# Patient Record
Sex: Female | Born: 1985 | Race: White | Hispanic: No | Marital: Married | Smoking: Never smoker
Health system: Southern US, Community
[De-identification: ages and names within clinical notes are randomized; demographics above are authoritative.]

## PROBLEM LIST (undated history)

## (undated) DIAGNOSIS — Z9289 Personal history of other medical treatment: Secondary | ICD-10-CM

## (undated) DIAGNOSIS — I214 Non-ST elevation (NSTEMI) myocardial infarction: Secondary | ICD-10-CM

## (undated) DIAGNOSIS — F419 Anxiety disorder, unspecified: Secondary | ICD-10-CM

## (undated) DIAGNOSIS — K219 Gastro-esophageal reflux disease without esophagitis: Secondary | ICD-10-CM

## (undated) HISTORY — DX: Personal history of other medical treatment: Z92.89

## (undated) HISTORY — PX: NO PAST SURGERIES: SHX2092

---

## 2013-09-14 DIAGNOSIS — Z9289 Personal history of other medical treatment: Secondary | ICD-10-CM

## 2013-09-14 HISTORY — DX: Personal history of other medical treatment: Z92.89

## 2013-09-22 DIAGNOSIS — I214 Non-ST elevation (NSTEMI) myocardial infarction: Secondary | ICD-10-CM

## 2013-09-22 DIAGNOSIS — Z9289 Personal history of other medical treatment: Secondary | ICD-10-CM

## 2013-09-22 HISTORY — DX: Non-ST elevation (NSTEMI) myocardial infarction: I21.4

## 2013-09-22 HISTORY — DX: Personal history of other medical treatment: Z92.89

## 2013-09-24 DIAGNOSIS — Z9289 Personal history of other medical treatment: Secondary | ICD-10-CM

## 2013-09-24 HISTORY — DX: Personal history of other medical treatment: Z92.89

## 2013-12-27 ENCOUNTER — Observation Stay (HOSPITAL_COMMUNITY)
Admission: EM | Admit: 2013-12-27 | Discharge: 2013-12-28 | Disposition: A | Discharge status: Home / Self Care | Payer: 59 | Attending: Cardiology | Admitting: Cardiology

## 2013-12-27 ENCOUNTER — Emergency Department (HOSPITAL_COMMUNITY): Payer: 59

## 2013-12-27 ENCOUNTER — Encounter (HOSPITAL_COMMUNITY): Payer: Self-pay | Admitting: Emergency Medicine

## 2013-12-27 DIAGNOSIS — F411 Generalized anxiety disorder: Secondary | ICD-10-CM | POA: Insufficient documentation

## 2013-12-27 DIAGNOSIS — I201 Angina pectoris with documented spasm: Secondary | ICD-10-CM

## 2013-12-27 DIAGNOSIS — I252 Old myocardial infarction: Secondary | ICD-10-CM

## 2013-12-27 DIAGNOSIS — R079 Chest pain, unspecified: Principal | ICD-10-CM

## 2013-12-27 DIAGNOSIS — K219 Gastro-esophageal reflux disease without esophagitis: Secondary | ICD-10-CM | POA: Insufficient documentation

## 2013-12-27 HISTORY — DX: Personal history of other medical treatment: Z92.89

## 2013-12-27 HISTORY — DX: Non-ST elevation (NSTEMI) myocardial infarction: I21.4

## 2013-12-27 HISTORY — DX: Anxiety disorder, unspecified: F41.9

## 2013-12-27 HISTORY — DX: Gastro-esophageal reflux disease without esophagitis: K21.9

## 2013-12-27 LAB — BASIC METABOLIC PANEL
BUN: 12 mg/dL (ref 6–23)
CHLORIDE: 101 meq/L (ref 96–112)
CO2: 25 mEq/L (ref 19–32)
Calcium: 10.2 mg/dL (ref 8.4–10.5)
Creatinine, Ser: 0.9 mg/dL (ref 0.50–1.10)
GFR calc non Af Amer: 87 mL/min — ABNORMAL LOW (ref >90)
Glucose, Bld: 94 mg/dL (ref 70–99)
POTASSIUM: 4 meq/L (ref 3.7–5.3)
SODIUM: 140 meq/L (ref 137–147)

## 2013-12-27 LAB — CBC
HCT: 40.7 % (ref 36.0–46.0)
Hemoglobin: 14.5 g/dL (ref 12.0–15.0)
MCH: 28.7 pg (ref 26.0–34.0)
MCHC: 35.6 g/dL (ref 30.0–36.0)
MCV: 80.4 fL (ref 78.0–100.0)
Platelets: 309 10*3/uL (ref 150–400)
RBC: 5.06 MIL/uL (ref 3.87–5.11)
RDW: 12.4 % (ref 11.5–15.5)
WBC: 10.4 10*3/uL (ref 4.0–10.5)

## 2013-12-27 LAB — PREGNANCY, URINE: PREG TEST UR: NEGATIVE

## 2013-12-27 LAB — PROTIME-INR
INR: 0.96 (ref 0.00–1.49)
INR: 1.08 (ref 0.00–1.49)
PROTHROMBIN TIME: 12.6 s (ref 11.6–15.2)
Prothrombin Time: 13.8 seconds (ref 11.6–15.2)

## 2013-12-27 LAB — RAPID URINE DRUG SCREEN, HOSP PERFORMED
Amphetamines: NOT DETECTED
Barbiturates: NOT DETECTED
Benzodiazepines: NOT DETECTED
Cocaine: NOT DETECTED
OPIATES: NOT DETECTED
Tetrahydrocannabinol: NOT DETECTED

## 2013-12-27 LAB — POCT I-STAT TROPONIN I: TROPONIN I, POC: 0 ng/mL (ref 0.00–0.08)

## 2013-12-27 LAB — MAGNESIUM: MAGNESIUM: 2 mg/dL (ref 1.5–2.5)

## 2013-12-27 LAB — TROPONIN I: Troponin I: <0.3 ng/mL (ref <0.30)

## 2013-12-27 MED ORDER — ATORVASTATIN CALCIUM 10 MG PO TABS
10.0000 mg | ORAL_TABLET | Freq: Every day | ORAL | Status: DC
  Administered 2013-12-28: 10 mg via ORAL
  Filled 2013-12-27 (×3): qty 1

## 2013-12-27 MED ORDER — SODIUM CHLORIDE 0.9 % IV BOLUS (SEPSIS)
1000.0000 mL | Freq: Once | INTRAVENOUS | Status: AC
  Administered 2013-12-27: 1000 mL via INTRAVENOUS

## 2013-12-27 MED ORDER — NITROGLYCERIN 0.4 MG SL SUBL
0.4000 mg | SUBLINGUAL_TABLET | SUBLINGUAL | Status: DC | PRN
  Administered 2013-12-27 (×3): 0.4 mg via SUBLINGUAL
  Filled 2013-12-27: qty 25

## 2013-12-27 MED ORDER — NITROGLYCERIN 0.4 MG SL SUBL
0.4000 mg | SUBLINGUAL_TABLET | SUBLINGUAL | Status: DC | PRN
  Filled 2013-12-27: qty 25

## 2013-12-27 MED ORDER — BUTALBITAL-APAP-CAFFEINE 50-325-40 MG PO TABS: 2.0000 | ORAL_TABLET | ORAL | Status: DC | PRN

## 2013-12-27 MED ORDER — ACETAMINOPHEN 325 MG PO TABS
650.0000 mg | ORAL_TABLET | ORAL | Status: DC | PRN
  Administered 2013-12-27 – 2013-12-28 (×3): 650 mg via ORAL
  Filled 2013-12-27 (×3): qty 2

## 2013-12-27 MED ORDER — ISOSORBIDE MONONITRATE 15 MG HALF TABLET
15.0000 mg | ORAL_TABLET | Freq: Every day | ORAL | Status: DC
  Administered 2013-12-28: 15 mg via ORAL
  Filled 2013-12-27: qty 1

## 2013-12-27 MED ORDER — ZOLPIDEM TARTRATE 5 MG PO TABS: 5.0000 mg | ORAL_TABLET | Freq: Every evening | ORAL | Status: DC | PRN

## 2013-12-27 MED ORDER — ATORVASTATIN CALCIUM 10 MG PO TABS: 10.0000 mg | ORAL_TABLET | Freq: Every day | ORAL | Status: DC

## 2013-12-27 MED ORDER — ASPIRIN 81 MG PO CHEW
243.0000 mg | CHEWABLE_TABLET | Freq: Once | ORAL | Status: AC
  Administered 2013-12-27: 243 mg via ORAL
  Filled 2013-12-27: qty 3

## 2013-12-27 MED ORDER — ENOXAPARIN SODIUM 40 MG/0.4ML ~~LOC~~ SOLN
40.0000 mg | SUBCUTANEOUS | Status: DC
  Administered 2013-12-28: 40 mg via SUBCUTANEOUS
  Filled 2013-12-27 (×3): qty 0.4

## 2013-12-27 MED ORDER — AMLODIPINE BESYLATE 5 MG PO TABS
5.0000 mg | ORAL_TABLET | Freq: Every day | ORAL | Status: DC
  Administered 2013-12-28: 5 mg via ORAL
  Filled 2013-12-27: qty 1

## 2013-12-27 MED ORDER — GI COCKTAIL ~~LOC~~
30.0000 mL | Freq: Once | ORAL | Status: AC
  Administered 2013-12-27: 30 mL via ORAL
  Filled 2013-12-27: qty 30

## 2013-12-27 MED ORDER — ASPIRIN EC 81 MG PO TBEC: 81.0000 mg | DELAYED_RELEASE_TABLET | Freq: Every day | ORAL | Status: DC

## 2013-12-27 MED ORDER — CLOPIDOGREL BISULFATE 75 MG PO TABS
75.0000 mg | ORAL_TABLET | Freq: Every day | ORAL | Status: DC
  Administered 2013-12-28: 75 mg via ORAL
  Filled 2013-12-27 (×2): qty 1

## 2013-12-27 MED ORDER — ONDANSETRON HCL 4 MG/2ML IJ SOLN
4.0000 mg | Freq: Four times a day (QID) | INTRAMUSCULAR | Status: DC | PRN
Start: 2013-12-27 — End: 2013-12-28

## 2013-12-27 MED ORDER — ATORVASTATIN CALCIUM 20 MG PO TABS
20.0000 mg | ORAL_TABLET | Freq: Every day | ORAL | Status: DC
  Filled 2013-12-27: qty 1

## 2013-12-27 MED ORDER — ASPIRIN EC 81 MG PO TBEC
81.0000 mg | DELAYED_RELEASE_TABLET | Freq: Every day | ORAL | Status: DC
  Administered 2013-12-28: 81 mg via ORAL
  Filled 2013-12-27: qty 1

## 2013-12-27 MED ORDER — ALPRAZOLAM 0.25 MG PO TABS: 0.2500 mg | ORAL_TABLET | Freq: Two times a day (BID) | ORAL | Status: DC | PRN

## 2013-12-27 NOTE — ED Notes (Signed)
Patient with c/o 1/10 CP after walking back to room from bathroom [facility] staff present on unit and have made charge nurse at [facility] aware of patient's c/o pain 1 SL NTG given, see MAR

## 2013-12-27 NOTE — ED Notes (Signed)
Bed: [id number] Expected date:  Expected time:  Means of arrival:  Comments: Hold chest pain in triage/ EKG done

## 2013-12-27 NOTE — Progress Notes (Signed)
   CARE MANAGEMENT ED NOTE 12/27/2013  Patient:  Eisenhour,Delma   Account Number:  [id number]  Date Initiated:  12/27/2013  Documentation initiated by:  Radford PaxFERRERO,Arrow Emmerich  Subjective/Objective Assessment:   Patient presents to ED with chest pain     Subjective/Objective Assessment Detail:     Action/Plan:   Action/Plan Detail:   Anticipated DC Date:       Status Recommendation to Physician:   Result of Recommendation:    Other ED Services  Consult Working Plan    DC Planning Services  Other  PCP issues    Choice offered to / List presented to:            Status of service:  Completed, signed off  ED Comments:   ED Comments Detail:  patient confirms her pcp is Dr. Edrick Ohobert Thomas.  System updated

## 2013-12-27 NOTE — H&P (Signed)
Stephanie Nash is an 28 y.o. female.    Primary Cardiologist:in [place]  Dr. Dorothy Puffer No primary provider on file.  Chief Complaint: chest pain HPI:  Stephanie Nash is a 28 y.o. female hx of MI 3 months ago (had positive troponins pk at 2.67, no cath was done) here with chest pain. Some bilateral arm pain since yesterday. Today around 11 AM had acute onset of substernal chest pain similar to 3 months ago. Denies any shortness of breath. She had CT angio chest several weeks ago as well as CT coronary arteries that showed no PE or obvious CAD. Her cardiologist in the [place].  She also had echo in Oct that was unremarkable.  Her cardiologist thought perhaps the MI was dissection vs. Spasm and with normal Cardiac CT he was thinking spasm.  She had been on metoprolol but was this was stopped 1-2 months ago and she is on amlodipine and statin as well as Plavix and ASA.  Since Oct. She may have had 4 episodes of chest pressure each lasting < 30 min.  One of these episodes she thought was GI.  She is working out.  With bursts of extreme exertion yesterday she had an episode of chest pressure.  But with rest it resolved.  Last night she had some rt arm pain.  Then today she did her workout without problems.  But later she developed mid to rt sternal chest pain.  It continues now despite GI cocktail.    She does continue with the pain now.      Past Medical History  Diagnosis Date  . MI (myocardial infarction)   . Hx of myocardial infarction; 09/2013, possible spasm 12/27/2013    History reviewed. No pertinent past surgical history.  History reviewed. No pertinent family history.  Parents are healthy no close relatives with CAD,  Social History:  reports that she has never smoked. She has never used smokeless tobacco. She reports that she drinks alcohol. She reports that she does not use illicit drugs. She is married and had been doing half marathons prior to Oct.  Allergies:    Allergies  Allergen Reactions  . Septra [Sulfamethoxazole-Tmp Ds] Hives    OUTPATIENT Medications: Plavix 75 mg daily ASA 81 mg daily Amlodipine 5 mg daily Lipitor daily  Results for orders placed during the hospital encounter of 12/27/13 (from the past 48 hour(s))  CBC     Status: None   Collection Time    12/27/13  1:58 PM      Result Value Range   WBC 10.4  4.0 - 10.5 K/uL   RBC 5.06  3.87 - 5.11 MIL/uL   Hemoglobin 14.5  12.0 - 15.0 g/dL   HCT 40.7  36.0 - 46.0 %   MCV 80.4  78.0 - 100.0 fL   MCH 28.7  26.0 - 34.0 pg   MCHC 35.6  30.0 - 36.0 g/dL   RDW 12.4  11.5 - 15.5 %   Platelets 309  150 - 400 K/uL  BASIC METABOLIC PANEL     Status: Abnormal   Collection Time    12/27/13  1:58 PM      Result Value Range   Sodium 140  137 - 147 mEq/L   Potassium 4.0  3.7 - 5.3 mEq/L   Chloride 101  96 - 112 mEq/L   CO2 25  19 - 32 mEq/L   Glucose, Bld 94  70 - 99 mg/dL  BUN 12  6 - 23 mg/dL   Creatinine, Ser 0.90  0.50 - 1.10 mg/dL   Calcium 10.2  8.4 - 10.5 mg/dL   GFR calc non Af Amer 87 (*) >90 mL/min   GFR calc Af Amer >90  >90 mL/min   Comment: (NOTE)     The eGFR has been calculated using the CKD EPI equation.     This calculation has not been validated in all clinical situations.     eGFR's persistently <90 mL/min signify possible Chronic Kidney     Disease.  PROTIME-INR     Status: None   Collection Time    12/27/13  1:58 PM      Result Value Range   Prothrombin Time 12.6  11.6 - 15.2 seconds   INR 0.96  0.00 - 1.49  POCT I-STAT TROPONIN I     Status: None   Collection Time    12/27/13  2:05 PM      Result Value Range   Troponin i, poc 0.00  0.00 - 0.08 ng/mL   Comment 3            Comment: Due to the release kinetics of cTnI,     a negative result within the first hours     of the onset of symptoms does not rule out     myocardial infarction with certainty.     If myocardial infarction is still suspected,     repeat the test at appropriate intervals.   URINE RAPID DRUG SCREEN (HOSP PERFORMED)     Status: None   Collection Time    12/27/13  3:25 PM      Result Value Range   Opiates NONE DETECTED  NONE DETECTED   Cocaine NONE DETECTED  NONE DETECTED   Benzodiazepines NONE DETECTED  NONE DETECTED   Amphetamines NONE DETECTED  NONE DETECTED   Tetrahydrocannabinol NONE DETECTED  NONE DETECTED   Barbiturates NONE DETECTED  NONE DETECTED   Comment:            DRUG SCREEN FOR MEDICAL PURPOSES     ONLY.  IF CONFIRMATION IS NEEDED     FOR ANY PURPOSE, NOTIFY LAB     WITHIN 5 DAYS.                LOWEST DETECTABLE LIMITS     FOR URINE DRUG SCREEN     Drug Class       Cutoff (ng/mL)     Amphetamine      1000     Barbiturate      200     Benzodiazepine   456     Tricyclics       256     Opiates          300     Cocaine          300     THC              50  PREGNANCY, URINE     Status: None   Collection Time    12/27/13  3:25 PM      Result Value Range   Preg Test, Ur NEGATIVE  NEGATIVE   Comment:            THE SENSITIVITY OF THIS     METHODOLOGY IS >20 mIU/mL.   Dg Chest Port 1 View  12/27/2013   CLINICAL DATA:  Chest pain  EXAM: PORTABLE CHEST - 1 VIEW  COMPARISON:  None.  FINDINGS: The heart size and mediastinal contours are within normal limits. Both lungs are clear. The visualized skeletal structures are unremarkable.  IMPRESSION: No active disease.   Electronically Signed   By: Inez Catalina M.D.   On: 12/27/2013 14:20    ROS: General:recent cold but no OTC meds or fevers, no weight changes Skin:no rashes or ulcers HEENT:no blurred vision, no congestion CV:see HPI PUL:see HPI GI:no diarrhea constipation or melena, no indigestion GU:no hematuria, no dysuria MS:no joint pain, no claudication Neuro:no syncope, no lightheadedness Endo:no diabetes, no thyroid disease   Blood pressure 129/88, pulse 83, resp. rate 18, last menstrual period 12/11/2013, SpO2 100.00%. PE: General:Pleasant affect, NAD Skin:Warm and dry, brisk  capillary refill HEENT:normocephalic, sclera clear, mucus membranes moist Neck:supple, no JVD, no bruits  Heart:S1S2 RRR without murmur, gallup, rub or click Lungs:clear without rales, rhonchi, or wheezes JJH:ERDE, non tender, + BS, do not palpate liver spleen or masses Ext:no lower ext edema, 2+ pedal pulses, 2+ radial pulses Neuro:alert and oriented, MAE, follows commands, + facial symmetry    Assessment/Plan   YCXKGY,JEHUD R Nurse Practitioner Certified [place] Pager [phone] or after 5pm or weekends call [phone] 12/27/2013, 5:36 PM  Patient seen with NP, agree with the above note.   I did review data from the hospital in [place] in 10/14:  She had TnI elevation to about 2.3 with chest pain.  ECG was unremarkable.  D dimer was elevated but PE CT showed no PE or other pulmonary abnormality.  She had a coronary CT angiogram done that showed no coronary anomaly and no evidence for CAD or for coronary dissection.  She was sent home on amlodipine 5 mg daily for possible vasospasm.  She was on Imdur (not sure what dose) that was stopped due to headache.  She had submaximal ETT prior to discharge that was normal.  TSH normal.  Echo showed EF 50-55%, no significant abnormalities.   Today, she had similar chest pain to 10/14.  It has been prolonged and is still present, 2/10. There was no particular trigger.  She has been jogging recently without symptoms.  ECG is normal and initial troponin is negative.  Urine drug screen negative, urine pregnancy test negative.   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  She appears to have had a thorough workup in 10/14 in [place].  No PE, no coronary anomaly and no evidence for coronary dissection or CAD.  Doubt myopericarditis as would be unusual to recur several months later.   - Admit to observation.  - Cycle cardiac enzymes - Continue amlodipine 5 mg daily - I will given her sublingual NTG to see if it helps  chest pain.  She had a headache on Imdur in the past, do not know what dose.  I am going to see if she can tolerate a low dose, 15 mg daily.  - Given the recent coronary CTA, I probably would not do left heart cath unless troponin were to be markedly elevated.   Loralie Champagne 12/27/2013 5:50 PM

## 2013-12-27 NOTE — ED Provider Notes (Signed)
CSN: [id number]     Arrival date & time 12/27/13  1324 History   First MD Initiated Contact with Patient 12/27/13 1339     Chief Complaint  Patient presents with  . Chest Pain   (Consider location/radiation/quality/duration/timing/severity/associated sxs/prior Treatment) The history is provided by the patient.  Stephanie Nash is a 28 y.o. female hx of MI 3 months ago (had positive troponins, no cath was done) here with chest pain. Some bilateral arm pain since yesterday. Today around 11 AM had acute onset of substernal chest pain similar to 3 months ago. Denies any shortness of breath. She had CT angio chest several weeks ago as well as CT coronary arteries that showed no PE or obvious CAD. Her cardiologist in the [place].    Past Medical History  Diagnosis Date  . MI (myocardial infarction)    History reviewed. No pertinent past surgical history. No family history on file. History  Substance Use Topics  . Smoking status: Never Smoker   . Smokeless tobacco: Not on file  . Alcohol Use: Yes   OB History   Grav Para Term Preterm Abortions TAB SAB Ect Mult Living                 Review of Systems  Cardiovascular: Positive for chest pain.  All other systems reviewed and are negative.    Allergies  Septra  Home Medications   Current Outpatient Rx  Name  Route  Sig  Dispense  Refill  . amLODipine (NORVASC) 5 MG tablet   Oral   Take 5 mg by mouth daily.         Marland Kitchen aspirin EC 81 MG tablet   Oral   Take 81 mg by mouth daily.         Marland Kitchen atorvastatin (LIPITOR) 10 MG tablet   Oral   Take 10 mg by mouth daily.         . clopidogrel (PLAVIX) 75 MG tablet   Oral   Take 75 mg by mouth daily with breakfast.          BP 129/88  Pulse 83  Resp 18  SpO2 100%  LMP 12/11/2013 Physical Exam  Nursing note and vitals reviewed. Constitutional: She is oriented to person, place, and time. She appears well-nourished.  Anxious, slightly uncomfortable   HENT:  Head:  Normocephalic.  Mouth/Throat: Oropharynx is clear and moist.  Eyes: Conjunctivae are normal. Pupils are equal, round, and reactive to light.  Neck: Normal range of motion. Neck supple.  Cardiovascular: Regular rhythm and normal heart sounds.   Slightly tachycardic   Pulmonary/Chest: Effort normal and breath sounds normal. No respiratory distress. She has no wheezes. She has no rales.  Abdominal: Soft. Bowel sounds are normal. She exhibits no distension. There is no tenderness. There is no rebound and no guarding.  Musculoskeletal: Normal range of motion. She exhibits no edema and no tenderness.  Neurological: She is alert and oriented to person, place, and time. No cranial nerve deficit. Coordination normal.  Skin: Skin is warm and dry.  Psychiatric: She has a normal mood and affect. Her behavior is normal. Thought content normal.    ED Course  Procedures (including critical care time) Labs Review Labs Reviewed  BASIC METABOLIC PANEL - Abnormal; Notable for the following:    GFR calc non Af Amer 87 (*)    All other components within normal limits  CBC  PROTIME-INR  PREGNANCY, URINE  URINE RAPID DRUG SCREEN (HOSP PERFORMED)  POCT  I-STAT TROPONIN I   Imaging Review Dg Chest Port 1 View  12/27/2013   CLINICAL DATA:  Chest pain  EXAM: PORTABLE CHEST - 1 VIEW  COMPARISON:  None.  FINDINGS: The heart size and mediastinal contours are within normal limits. Both lungs are clear. The visualized skeletal structures are unremarkable.  IMPRESSION: No active disease.   Electronically Signed   By: Alcide CleverMark  Lukens M.D.   On: 12/27/2013 14:20    EKG Interpretation    Date/Time:  Tuesday December 27 2013 13:30:55 EST Ventricular Rate:  94 PR Interval:  156 QRS Duration: 81 QT Interval:  362 QTC Calculation: 453 R Axis:   55 Text Interpretation:  Sinus rhythm No previous ECGs available Confirmed by Stephanie Bigos  MD, Stephanie Nash [phone]([id number]) on 12/27/2013 1:50:27 PM            MDM  No diagnosis found. Stephanie LobeKatie  Nash is a 28 y.o. female here with chest pain. Possible reflux vs ACS. Given MI several months ago, may need cardiology consult.   3:59 PM Trop neg x 1. I called cardiology to evaluate. I signed out to Dr. Denton Nash to follow up the consult.   Richardean Canalavid H Sneha Willig, MD 12/27/13 [phone]

## 2013-12-27 NOTE — ED Notes (Signed)
Assumed care of patient Patient and family informed that [facility] staff will be present shortly--v/u Patient ambulated to bathroom without difficulty or assistance Patient denies complaints or further needs at this time

## 2013-12-27 NOTE — ED Notes (Addendum)
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 Pt sts hx of acute MI in October 2014.

## 2013-12-28 ENCOUNTER — Other Ambulatory Visit: Payer: Self-pay

## 2013-12-28 ENCOUNTER — Encounter (HOSPITAL_COMMUNITY): Payer: Self-pay | Admitting: Physician Assistant

## 2013-12-28 DIAGNOSIS — K219 Gastro-esophageal reflux disease without esophagitis: Secondary | ICD-10-CM | POA: Insufficient documentation

## 2013-12-28 DIAGNOSIS — I201 Angina pectoris with documented spasm: Secondary | ICD-10-CM

## 2013-12-28 DIAGNOSIS — F419 Anxiety disorder, unspecified: Secondary | ICD-10-CM | POA: Insufficient documentation

## 2013-12-28 LAB — TROPONIN I
Troponin I: <0.3 ng/mL (ref <0.30)
Troponin I: <0.3 ng/mL (ref <0.30)

## 2013-12-28 LAB — HEPATIC FUNCTION PANEL
ALT: 20 U/L (ref 0–35)
AST: 20 U/L (ref 0–37)
Albumin: 3.4 g/dL — ABNORMAL LOW (ref 3.5–5.2)
Alkaline Phosphatase: 47 U/L (ref 39–117)
Bilirubin, Direct: <0.2 mg/dL (ref 0.0–0.3)
TOTAL PROTEIN: 6 g/dL (ref 6.0–8.3)
Total Bilirubin: 0.4 mg/dL (ref 0.3–1.2)

## 2013-12-28 MED ORDER — NITROGLYCERIN 0.4 MG SL SUBL: 0.4000 mg | SUBLINGUAL_TABLET | SUBLINGUAL | Status: AC | PRN

## 2013-12-28 MED ORDER — ISOSORBIDE MONONITRATE ER 30 MG PO TB24: 15.0000 mg | ORAL_TABLET | Freq: Every day | ORAL | Status: DC

## 2013-12-28 NOTE — Progress Notes (Signed)
At about 2220 pt complained of 3/10 chest discomfort/pressure located in the mid chest.  An ECG was performed, 1 sublingual nitro given and pt discomfort/pressure went to 0/10 within 3 minutes of nitro being given.  MD on call notified.  No new orders.  Will continue to monitor.

## 2013-12-28 NOTE — Progress Notes (Signed)
Subjective:  No further angina. ECG and labs normal.  Objective:  Temp:  [98 F (36.7 C)-98.4 F (36.9 C)] 98 F (36.7 C) (01/14 0527) Pulse Rate:  [64-94] 64 (01/14 0527) Resp:  [16-23] 16 (01/14 0527) BP: (99-129)/(54-88) 101/62 mmHg (01/14 1008) SpO2:  [99 %-100 %] 100 % (01/14 0527) Weight:  [159 lb 12.8 oz (72.485 kg)] 159 lb 12.8 oz (72.485 kg) (01/13 2127) Weight change:   Intake/Output from previous day: 01/13 0701 - 01/14 0700 In: 400 [P.O.:400] Out: -   Intake/Output from this shift:    Medications: Current Facility-Administered Medications  Medication Dose Route Frequency Provider Last Rate Last Dose  . acetaminophen (TYLENOL) tablet 650 mg  650 mg Oral Q4H PRN Cecilie Kicks, NP   650 mg at 12/27/13 2235  . ALPRAZolam Duanne Moron) tablet 0.25 mg  0.25 mg Oral BID PRN Cecilie Kicks, NP      . amLODipine (NORVASC) tablet 5 mg  5 mg Oral Daily Cecilie Kicks, NP   5 mg at 12/28/13 1009  . aspirin EC tablet 81 mg  81 mg Oral Daily Cecilie Kicks, NP   81 mg at 12/28/13 1009  . atorvastatin (LIPITOR) tablet 10 mg  10 mg Oral q1800 Cecilie Kicks, NP   10 mg at 12/28/13 0012  . butalbital-acetaminophen-caffeine (FIORICET, ESGIC) 50-325-40 MG per tablet 2 tablet  2 tablet Oral Q4H PRN Cecilie Kicks, NP      . clopidogrel (PLAVIX) tablet 75 mg  75 mg Oral Q breakfast Cecilie Kicks, NP   75 mg at 12/28/13 0739  . enoxaparin (LOVENOX) injection 40 mg  40 mg Subcutaneous Q24H Cecilie Kicks, NP   40 mg at 12/28/13 0013  . isosorbide mononitrate (IMDUR) 24 hr tablet 15 mg  15 mg Oral Daily Cecilie Kicks, NP   15 mg at 12/28/13 1009  . nitroGLYCERIN (NITROSTAT) SL tablet 0.4 mg  0.4 mg Sublingual Q5 Min x 3 PRN Cecilie Kicks, NP   0.4 mg at 12/27/13 2227  . ondansetron (ZOFRAN) injection 4 mg  4 mg Intravenous Q6H PRN Cecilie Kicks, NP      . zolpidem (AMBIEN) tablet 5 mg  5 mg Oral QHS PRN,MR X 1 Cecilie Kicks, NP        Physical Exam:  General: Alert, oriented x3, no distress Head: no  evidence of trauma, PERRL, EOMI, no exophtalmos or lid lag, no myxedema, no xanthelasma; normal ears, nose and oropharynx Neck: normal jugular venous pulsations and no hepatojugular reflux; brisk carotid pulses without delay and no carotid bruits Chest: clear to auscultation, no signs of consolidation by percussion or palpation, normal fremitus, symmetrical and full respiratory excursions Cardiovascular: normal position and quality of the apical impulse, regular rhythm, normal first and second heart sounds, no murmurs, rubs or gallops Abdomen: no tenderness or distention, no masses by palpation, no abnormal pulsatility or arterial bruits, normal bowel sounds, no hepatosplenomegaly Extremities: no clubbing, cyanosis or edema; 2+ radial, ulnar and brachial pulses bilaterally; 2+ right femoral, posterior tibial and dorsalis pedis pulses; 2+ left femoral, posterior tibial and dorsalis pedis pulses; no subclavian or femoral bruits Neurological: grossly nonfocal   Lab Results: Results for orders placed during the hospital encounter of 12/27/13 (from the past 48 hour(s))  CBC     Status: None   Collection Time    12/27/13  1:58 PM      Result Value Range   WBC 10.4  4.0 - 10.5 K/uL   RBC 5.06  3.87 -  5.11 MIL/uL   Hemoglobin 14.5  12.0 - 15.0 g/dL   HCT 40.7  36.0 - 46.0 %   MCV 80.4  78.0 - 100.0 fL   MCH 28.7  26.0 - 34.0 pg   MCHC 35.6  30.0 - 36.0 g/dL   RDW 12.4  11.5 - 15.5 %   Platelets 309  150 - 400 K/uL  BASIC METABOLIC PANEL     Status: Abnormal   Collection Time    12/27/13  1:58 PM      Result Value Range   Sodium 140  137 - 147 mEq/L   Potassium 4.0  3.7 - 5.3 mEq/L   Chloride 101  96 - 112 mEq/L   CO2 25  19 - 32 mEq/L   Glucose, Bld 94  70 - 99 mg/dL   BUN 12  6 - 23 mg/dL   Creatinine, Ser 0.90  0.50 - 1.10 mg/dL   Calcium 10.2  8.4 - 10.5 mg/dL   GFR calc non Af Amer 87 (*) >90 mL/min   GFR calc Af Amer >90  >90 mL/min   Comment: (NOTE)     The eGFR has been  calculated using the CKD EPI equation.     This calculation has not been validated in all clinical situations.     eGFR's persistently <90 mL/min signify possible Chronic Kidney     Disease.  PROTIME-INR     Status: None   Collection Time    12/27/13  1:58 PM      Result Value Range   Prothrombin Time 12.6  11.6 - 15.2 seconds   INR 0.96  0.00 - 1.49  POCT I-STAT TROPONIN I     Status: None   Collection Time    12/27/13  2:05 PM      Result Value Range   Troponin i, poc 0.00  0.00 - 0.08 ng/mL   Comment 3            Comment: Due to the release kinetics of cTnI,     a negative result within the first hours     of the onset of symptoms does not rule out     myocardial infarction with certainty.     If myocardial infarction is still suspected,     repeat the test at appropriate intervals.  URINE RAPID DRUG SCREEN (HOSP PERFORMED)     Status: None   Collection Time    12/27/13  3:25 PM      Result Value Range   Opiates NONE DETECTED  NONE DETECTED   Cocaine NONE DETECTED  NONE DETECTED   Benzodiazepines NONE DETECTED  NONE DETECTED   Amphetamines NONE DETECTED  NONE DETECTED   Tetrahydrocannabinol NONE DETECTED  NONE DETECTED   Barbiturates NONE DETECTED  NONE DETECTED   Comment:            DRUG SCREEN FOR MEDICAL PURPOSES     ONLY.  IF CONFIRMATION IS NEEDED     FOR ANY PURPOSE, NOTIFY LAB     WITHIN 5 DAYS.                LOWEST DETECTABLE LIMITS     FOR URINE DRUG SCREEN     Drug Class       Cutoff (ng/mL)     Amphetamine      1000     Barbiturate      200     Benzodiazepine   604     Tricyclics  300     Opiates          300     Cocaine          300     THC              50  PREGNANCY, URINE     Status: None   Collection Time    12/27/13  3:25 PM      Result Value Range   Preg Test, Ur NEGATIVE  NEGATIVE   Comment:            THE SENSITIVITY OF THIS     METHODOLOGY IS >20 mIU/mL.  TROPONIN I     Status: None   Collection Time    12/27/13  5:12 PM       Result Value Range   Troponin I <0.30  <0.30 ng/mL   Comment:            Due to the release kinetics of cTnI,     a negative result within the first hours     of the onset of symptoms does not rule out     myocardial infarction with certainty.     If myocardial infarction is still suspected,     repeat the test at appropriate intervals.  PROTIME-INR     Status: None   Collection Time    12/27/13  5:12 PM      Result Value Range   Prothrombin Time 13.8  11.6 - 15.2 seconds   INR 1.08  0.00 - 1.49  MAGNESIUM     Status: None   Collection Time    12/27/13  5:12 PM      Result Value Range   Magnesium 2.0  1.5 - 2.5 mg/dL  HEPATIC FUNCTION PANEL     Status: Abnormal   Collection Time    12/28/13  7:10 AM      Result Value Range   Total Protein 6.0  6.0 - 8.3 g/dL   Albumin 3.4 (*) 3.5 - 5.2 g/dL   AST 20  0 - 37 U/L   ALT 20  0 - 35 U/L   Alkaline Phosphatase 47  39 - 117 U/L   Total Bilirubin 0.4  0.3 - 1.2 mg/dL   Bilirubin, Direct <0.2  0.0 - 0.3 mg/dL   Indirect Bilirubin NOT CALCULATED  0.3 - 0.9 mg/dL  TROPONIN I     Status: None   Collection Time    12/28/13  7:10 AM      Result Value Range   Troponin I <0.30  <0.30 ng/mL   Comment:            Due to the release kinetics of cTnI,     a negative result within the first hours     of the onset of symptoms does not rule out     myocardial infarction with certainty.     If myocardial infarction is still suspected,     repeat the test at appropriate intervals.  TROPONIN I     Status: None   Collection Time    12/28/13 11:36 AM      Result Value Range   Troponin I <0.30  <0.30 ng/mL   Comment:            Due to the release kinetics of cTnI,     a negative result within the first hours     of the onset of symptoms does not rule out  myocardial infarction with certainty.     If myocardial infarction is still suspected,     repeat the test at appropriate intervals.    Imaging: Imaging results have been reviewed  and Dg Chest Port 1 View  12/27/2013   CLINICAL DATA:  Chest pain  EXAM: PORTABLE CHEST - 1 VIEW  COMPARISON:  None.  FINDINGS: The heart size and mediastinal contours are within normal limits. Both lungs are clear. The visualized skeletal structures are unremarkable.  IMPRESSION: No active disease.   Electronically Signed   By: Inez Catalina M.D.   On: 12/27/2013 14:20    Assessment:  1. Principal Problem: 2.   Chest pain at rest 3. Active Problems: 4.   Hx of myocardial infarction; 09/2013, possible spasm 5.   Plan:  1. Coronary vasospasm remains the most likely diagnosis but is difficult to prove. Would use amlodipine 5 mg daily (every day unless she has symptomatic hypotension) and isosorbide mononitrate 15 mg daily, with additional SL NTG prn. Cautioned to seek emergency care if symptoms last over 20-30 minutes. 2. She will likely choose to follow up with her Cardiologist in [place], but we will be happy to assist since she works here in [place]. 3. I see no reason to curtail regular physical exercise, but would stick to aerobic, moderate intensity exercise. 4. DC home today.  Time Spent Directly with Patient:  45 minutes  Length of Stay:  LOS: 1 day    Raylyn Carton 12/28/2013, 1:01 PM

## 2013-12-28 NOTE — Discharge Summary (Signed)
Discharge Summary   Patient ID: Stephanie Nash MRN: [id number], DOB/AGE: 19-Jun-1986 28 y.o. Admit date: 12/27/2013 D/C date:     12/28/2013  Primary Care Provider: Daryl Eastern, MD Primary Cardiologist: Dr. Arlana Lindau in [place]  Primary Discharge Diagnoses:  1. Chest pain, suspect coronary vasospasm - no objective evidence of ischemia this admission 2. History of NSTEMI 09/2013 possibly due to vasospasm  Secondary Discharge Diagnoses:  1. GERD 2. Anxiety (slight per patient)  Hospital Course: Stephanie Nash is a 28 y/o F with history of MI 09/2013 (positive troponin peak 2.67, no cath was done, ?possible vasospasm) who presented to [facility] yesterday with chest pain. She works at the [employer] at [employer]. On day of admission she had acute onset of substernal chest pain similar to her prior event. She denied any SOB. She had CT angio chest several weeks ago as well as CT coronary arteries that showed no PE or obvious CAD. Her cardiologist in the [place]. She also had echo in October that was unremarkable. Her cardiologist thought perhaps the MI was dissection vs. spasm and with normal cardiac CT, he was thinking spasm. She had been on metoprolol but was this was stopped 1-2 months ago and she is on amlodipine, statin, Plavix and ASA. Since October 2014, she has had about 4 episodes of chest pressure each lasting less than 30 minutes. One of these episodes she thought was GI related. With bursts of extreme exertion yesterday, she had an episode of chest pressure which resolved with rest. Later in the evening she had some right arm pain. While exercising on day of admission she did her workout without exertional symptoms, but later developed mid to right sternal chest pain prompting her to seek care in the ER. Initial troponin was negative, UDS neg, uHcg neg, CXR unremarkable, VSS, ECG was normal. The most likely diagnosis was felt to be coronary vasospasm although this was difficult  to prove. Troponins remained negative this admission. Her team doubted myopericarditis as this would be unusual to recur several months later. LHC was not pursued due to recent normal coronary CTA. She was admitted overnight for further observation and remained stable without further angina. Dr. Royann Shivers recommended continuing Norvasc along with addition of low-dose Imdur at 15mg  daily with SL NTG PRN. She has history of headache with Imdur in the past but will see if she can tolerate this. She was advised to seek emergency care if symptoms last over 20-30 minutes. Dr. Royann Shivers sees no reason to curtail regular physical exercise but recommends sticking to aerobic, moderate intensity exercise. He has seen and examined the patient today and feels she is stable for discharge. She would like to follow up with Dr. Alisa Graff in [facility] and was encouraged to schedule an appointment to see him in 1-2 weeks.  Discharge Vitals: Blood pressure 101/62, pulse 64, temperature 98 F (36.7 C), temperature source Oral, resp. rate 16, height 5\' 6"  (1.676 m), weight 159 lb 12.8 oz (72.485 kg), last menstrual period 12/11/2013, SpO2 100.00%.  Labs: Lab Results  Component Value Date   WBC 10.4 12/27/2013   HGB 14.5 12/27/2013   HCT 40.7 12/27/2013   MCV 80.4 12/27/2013   PLT 309 12/27/2013     Recent Labs Lab 12/27/13 1358 12/28/13 0710  NA 140  --   K 4.0  --   CL 101  --   CO2 25  --   BUN 12  --   CREATININE 0.90  --  CALCIUM 10.2  --   PROT  --  6.0  BILITOT  --  0.4  ALKPHOS  --  47  ALT  --  20  AST  --  20  GLUCOSE 94  --     Recent Labs  12/27/13 1712 12/28/13 0710 12/28/13 1136  TROPONINI <0.30 <0.30 <0.30     Diagnostic Studies/Procedures   Dg Chest Port 1 View 12/27/2013   CLINICAL DATA:  Chest pain  EXAM: PORTABLE CHEST - 1 VIEW  COMPARISON:  None.  FINDINGS: The heart size and mediastinal contours are within normal limits. Both lungs are clear. The visualized skeletal structures are  unremarkable.  IMPRESSION: No active disease.   Electronically Signed   By: Alcide CleverMark  Lukens M.D.   On: 12/27/2013 14:20    Discharge Medications     Medication List         amLODipine 5 MG tablet  Commonly known as:  NORVASC  Take 5 mg by mouth daily.     aspirin EC 81 MG tablet  Take 81 mg by mouth daily.     atorvastatin 10 MG tablet  Commonly known as:  LIPITOR  Take 10 mg by mouth daily.     clopidogrel 75 MG tablet  Commonly known as:  PLAVIX  Take 75 mg by mouth daily with breakfast.     isosorbide mononitrate 30 MG 24 hr tablet  Commonly known as:  IMDUR  Take 0.5 tablets (15 mg total) by mouth daily.     nitroGLYCERIN 0.4 MG SL tablet  Commonly known as:  NITROSTAT  Place 1 tablet (0.4 mg total) under the tongue every 5 (five) minutes as needed for chest pain (up to 3 doses).        Disposition   The patient will be discharged in stable condition to home. Discharge Orders   Future Orders Complete By Expires   Diet - low sodium heart healthy  As directed    Discharge instructions  As directed    Comments:     - Please seek emergency care if symptoms persist. - Dr. Royann Shiversroitoru sees no reason to curtail regular physical exercise but recommends sticking to aerobic, moderate intensity exercise.   Increase activity slowly  As directed    Scheduling Instructions:     You may return to work 12/30/13.     Follow-up Information   Schedule an appointment as soon as possible for a visit with Dr. Alisa Graffuffy in [place]. (To be seen in 1-2 weeks)         Duration of Discharge Encounter: Greater than 30 minutes including physician and PA time.  Signed, Ronie Spiesayna Adaysha Dubinsky PA-C 12/28/2013, 2:37 PM

## 2013-12-28 NOTE — Progress Notes (Signed)
DC orders received.  Patient stable with no S/S of distress.  Medication and discharge information reviewed with patient and family.  Patient DC home. [place], Stephanie HansenJessica Nash

## 2013-12-28 NOTE — Progress Notes (Signed)
Troponin was not drawn at scheduled time, phlebotomy called and RN was told that the labs were not showing up to be drawn in their system.  Hepatic function panel and Troponin orders were discontinued and then reordered.  Lab was called again and verified that labs were now showing up in their system and will come up to draw.

## 2014-01-12 ENCOUNTER — Encounter: Payer: Self-pay | Admitting: *Deleted

## 2014-01-19 ENCOUNTER — Ambulatory Visit (INDEPENDENT_AMBULATORY_CARE_PROVIDER_SITE_OTHER): Payer: 59 | Admitting: Physician Assistant

## 2014-01-19 ENCOUNTER — Encounter: Payer: Self-pay | Admitting: Physician Assistant

## 2014-01-19 VITALS — BP 96/60 | HR 72 | Ht 66.0 in | Wt 156.8 lb

## 2014-01-19 DIAGNOSIS — K219 Gastro-esophageal reflux disease without esophagitis: Secondary | ICD-10-CM

## 2014-01-19 DIAGNOSIS — I252 Old myocardial infarction: Secondary | ICD-10-CM

## 2014-01-19 DIAGNOSIS — I201 Angina pectoris with documented spasm: Secondary | ICD-10-CM

## 2014-01-19 MED ORDER — ASPIRIN EC 81 MG PO TBEC: 81.0000 mg | DELAYED_RELEASE_TABLET | Freq: Every day | ORAL | Status: DC

## 2014-01-19 MED ORDER — ATORVASTATIN CALCIUM 10 MG PO TABS
10.0000 mg | ORAL_TABLET | Freq: Every day | ORAL | Status: DC
Start: 2014-01-19 — End: 2014-05-10

## 2014-01-19 NOTE — Progress Notes (Signed)
[address] [place], [place]  [address] Phone: [phone] Fax:  [phone]  Date:  01/19/2014   ID:  Stephanie Nash, DOB 15-Oct-1986, MRN [id number]  PCP:  Daryl Eastern, MD  Cardiologist:  Dr. Arlana Lindau in [place], [place] => Dr. Marca Ancona    History of Present Illness: Stephanie Nash is a 28 y.o. female with hx of NSTEMI in 09/2013 treated in [place] where she lives.  Peak TnI 2.3 and DDimer was elevated.  CT was neg for PE.  Cardiac CTA was neg for coronary anomaly, CAD or dissection.  She was d/c on Amlodipine for possible vasospasm.  Of note Isosorbide was d/c 2/2 HA.  Sub-maximal ETT prior to d/c was normal.  TSH was normal.  Echo showed EF 50-55%.    She was admitted to [facility] 1/13-1/14 with chest pain similar to 09/2013.  She ruled out for MI. Symptoms were felt to be due to vasospasm. Low dose Isosorbide 15 mg was added to see if she could tolerate.  She was d/c with plans to follow up with her cardiologist in [place].    She scheduled follow up here b/c she thinks she would like to be followed by Dr. Marca Ancona.  Since d/c, she denies any further chest pain.  She denies dyspnea, orthopnea, PND, edema.  No syncope.  She has occasional indigestion.   Recent Labs: 12/27/2013: Creatinine 0.90; Hemoglobin 14.5; Potassium 4.0  12/28/2013: ALT 20   Wt Readings from Last 3 Encounters:  01/19/14 156 lb 12.8 oz (71.124 kg)  12/27/13 159 lb 12.8 oz (72.485 kg)     Past Medical History  Diagnosis Date  . Abnormal cardiac CT angiography 09/2013    negative for anatomical anomaly, no significant ischemic burden  . H/O exercise stress test 09/24/2013    submaximal exercise tolerance test. exercised to level of 7 mets, no EKG changes or chest pain.  . H/O echocardiogram 09/22/2013    EF 50-55% with normal contraction motility. Aortic root could not be well visualized.  . H/O CT scan of chest 09/2013    angio ct and PE ruled out  . NSTEMI (non-ST elevated  myocardial infarction) 09/22/2013    a. NSTEMI 09/2013 -pk troponin 2.67. No cath done at that time. b. Further evaluative studies as above.  Marland Kitchen GERD (gastroesophageal reflux disease)   . Anxiety     "slight"    Current Outpatient Prescriptions  Medication Sig Dispense Refill  . amLODipine (NORVASC) 5 MG tablet Take 5 mg by mouth daily.      . isosorbide mononitrate (IMDUR) 30 MG 24 hr tablet Take 0.5 tablets (15 mg total) by mouth daily.  30 tablet  6  . nitroGLYCERIN (NITROSTAT) 0.4 MG SL tablet Place 1 tablet (0.4 mg total) under the tongue every 5 (five) minutes as needed for chest pain (up to 3 doses).  25 tablet  3  . aspirin EC 81 MG tablet Take 1 tablet (81 mg total) by mouth daily.      Marland Kitchen atorvastatin (LIPITOR) 10 MG tablet Take 1 tablet (10 mg total) by mouth daily.       No current facility-administered medications for this visit.    Allergies:   Septra   Social History:  The patient  reports that she has never smoked. She has never used smokeless tobacco. She reports that she drinks alcohol. She reports that she does not use illicit drugs.   Family History:  The patient's family  history includes Healthy in her father and mother.   ROS:  Please see the history of present illness.      All other systems reviewed and negative.   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  She wants to follow up with Dr. Marca Anconaalton Nash  here.  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  She was taken off of ASA, Plavix and Lipitor by her cardiologist.  I recommended she resume Lipitor (due to effects on improved endothelial function) and ASA 81 QD (she had + CEs in 09/2013).  We discussed the need to stop statin Rx if she plans to get pregnant.  She and her mother had many questions today.  I tried to answer all of them.  I spent > 30 minutes counseling the patient and her mother today.  She knows when to use PRN NTG.   2. GERD:  She has some symptoms of chest pain that sound c/w GERD. I have asked her to take Pepcid when she knows she will be eating foods that may trigger acid reflux.   3. Disposition:  F/u with Dr. Marca Anconaalton Nash in 3 mos.   Signed, Tereso NewcomerScott Weaver, PA-C  01/19/2014 4:52 PM

## 2014-01-19 NOTE — Patient Instructions (Signed)
RE-START LIPITOR 10 MG DAILY RE-START ASPIRIN 81 MG DAILY  Your physician wants you to follow-up in: 3 MONTHS WITH DR. Shirlee LatchMCLEAN. You will receive a reminder letter in the mail two months in advance. If you don't receive a letter, please call our office to schedule the follow-up appointment.

## 2014-02-10 ENCOUNTER — Encounter: Payer: Self-pay | Admitting: Physician Assistant

## 2014-03-28 ENCOUNTER — Telehealth: Payer: Self-pay | Admitting: Cardiology

## 2014-03-28 NOTE — Telephone Encounter (Signed)
Called wanting us to document that she had an episode of vasospasm yesterday morning.  States she took NTG with minimal relief and then took another NTG 15 min later with complete relief. States she feels fine today.  States she isn't sure when she has reflux or when to take Pepcid.  States she did eat Pizza the night before but has never had the pain when she ate Pizza in the past. Advised that if the pain reoccurs and she has taken 3 NTG without relief then she needs to call 911.  She understands and will call if she experiences pain again.

## 2014-03-28 NOTE — Telephone Encounter (Signed)
° °  Patient had a episode that she just wanted you to be aware of. She has chest burning and tightness yesterday. She took 2 nitroglycerin and immediately felt better, she would just like to speak with you. Please call and advise.

## 2014-05-10 ENCOUNTER — Encounter: Payer: Self-pay | Admitting: Cardiology

## 2014-05-10 ENCOUNTER — Ambulatory Visit (INDEPENDENT_AMBULATORY_CARE_PROVIDER_SITE_OTHER): Payer: 59 | Admitting: Cardiology

## 2014-05-10 VITALS — BP 120/72 | HR 63 | Ht 66.0 in | Wt 157.0 lb

## 2014-05-10 DIAGNOSIS — I201 Angina pectoris with documented spasm: Secondary | ICD-10-CM

## 2014-05-10 DIAGNOSIS — I252 Old myocardial infarction: Secondary | ICD-10-CM

## 2014-05-10 DIAGNOSIS — R079 Chest pain, unspecified: Secondary | ICD-10-CM

## 2014-05-10 MED ORDER — ISOSORBIDE MONONITRATE ER 30 MG PO TB24: 30.0000 mg | ORAL_TABLET | Freq: Every day | ORAL | Status: DC

## 2014-05-10 NOTE — Progress Notes (Signed)
Patient ID: Stephanie Nash, female   DOB: 09/21/1986, 28 y.o.   MRN: [id number] 28 yo with history of possible coronary artery vasospasm presents for followup.  She had an extensive workup after presentation to hospital in [place] in 10/14 with chest pain and elevated troponin.  Workup was negative for coronary dissection, obstructive CAD, PE, and coronary anomaly.  She presented again to [facility] in 1/15 with chest pain and ruled out for MI.  She had another episode of chest pain in 4/15 at work.  She took nitroglycerin sublingual and the pain resolved.  No chest pain since that time.  She says that she has an episode about every 3 months, and the episodes tend to be associated with emotional stress (bad day at work, etc).  She has been able to tolerate Imdur 15 mg daily without headaches and she is on amlodipine.  She exercises regularly (jogging) with no exertional symptoms.    Labs (10/14): TSH normal Labs (1/15): K 4, creatinine 0.9  PMH: 1. Suspect coronary vasospasm: Chest pain in 10/14 in setting of emotion stress.  TnI to 2.67.  She had PE CT that showed no PE. She had coronary CT angiogram that showed no dissection, obstructive CAD, or coronary anomaly.  TSH was normal.  She had a submaximal ETT prior to discharge that was normal.  Echo with EF 50-55% (All done at the hospital in [place]).  She had repeat admission with CP to [facility] in 1/15, workup showed normal troponin and was otherwise unremarkable.   2. 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  Nonsmoker.  Occasional ETOH.   FH: No premature CAD.   ROS: All systems reviewed and negative except as per HPI.   Current Outpatient Prescriptions  Medication Sig Dispense Refill  . amLODipine (NORVASC) 5 MG tablet Take 5 mg by mouth daily.      Marland Kitchen. aspirin EC 81 MG tablet Take 1 tablet (81 mg total) by mouth daily.      . nitroGLYCERIN (NITROSTAT) 0.4 MG SL tablet Place 1 tablet (0.4 mg total) under  the tongue every 5 (five) minutes as needed for chest pain (up to 3 doses).  25 tablet  3  . isosorbide mononitrate (IMDUR) 30 MG 24 hr tablet Take 1 tablet (30 mg total) by mouth daily.  90 tablet  2   No current facility-administered medications for this visit.    BP 120/72  Pulse 63  Ht 5\' 6"  (1.676 m)  Wt 157 lb (71.215 kg)  BMI 25.35 kg/m2 General: NAD Neck: No JVD, no thyromegaly or thyroid nodule.  Lungs: Clear to auscultation bilaterally with normal respiratory effort. CV: Nondisplaced PMI.  Heart regular S1/S2, no S3/S4, no murmur.  No peripheral edema.  No carotid bruit.  Normal pedal pulses.  Abdomen: Soft, nontender, no hepatosplenomegaly, no distention.  Skin: Intact without lesions or rashes.  Neurologic: Alert and oriented x 3.  Psych: Normal affect. Extremities: No clubbing or cyanosis.   Assessment/Plan:  28 yo with history of suspected coronary vasospasm.  Symptoms resolve with NTG.  Coronary CT showed no obstructive CAD, no coronary dissection, and no coronary anomaly.  PE CT at initial presentation showed no PE.  She had elevated troponin at first presentation, no elevation of troponin at [facility] in 1/15.  Symptoms seem to be related to emotional stress.  - Continue amlodipine, increase Imdur to 30 mg daily.  - She asks about pregnancy.  She  should be able to continue amlodipine and Imdur during pregnancy.  Given the stress of pregnancy, vasospasm may be more likely to occur, but no way to know for sure.  Would make sure she stays on her meds as long as BP tolerates.  Would wait a year after NSTEMI event if possible.   Laurey Morale 05/10/2014 10:10 AM

## 2014-05-10 NOTE — Patient Instructions (Signed)
Increase Imdur (isosorbide) to 30mg  daily.   Your physician wants you to follow-up in: 6 months with Dr Shirlee Latch. (November 2015).  You will receive a reminder letter in the mail two months in advance. If you don't receive a letter, please call our office to schedule the follow-up appointment.

## 2014-06-19 ENCOUNTER — Telehealth: Payer: Self-pay | Admitting: Cardiology

## 2014-06-19 NOTE — Telephone Encounter (Signed)
SPOKE  WITH PT   JUST  WANTED DR  MCLEAN TO   KNOW THAT SHE  HAD AN EPISODE  LAST NIGHT  ALSO DID NOT KNOW  IF  YOU  KNEW OF ANY TRIGGERS  THAT  COULD  CAUSE  THE  EPISODES PT  AWARE  WILL FORWARD  TO [facility] AND THAT  DR MCLEAN OUT OF OFFICE  THIS  WEEK  PT  AWARE  THAT  S/S  WORSEN  OR  BECOME MORE  FREQUENT  TO GO TO ER  FOR  EVAL  AND TX .Zack Seal/CY

## 2014-06-19 NOTE — Telephone Encounter (Signed)
New message     Last night pt had chest pain from 7:30-11; took nitro at 10:15.  She seems to have this every 3 months.  Want to let the doctor know.  She is ok now

## 2014-06-20 NOTE — Telephone Encounter (Signed)
Hard to know, possibly stress-induced.

## 2014-06-20 NOTE — Telephone Encounter (Signed)
**Note De-Identified Xachary Hambly Obfuscation** The pt is advised, she verbalized understanding and gratitude for Dr Shirlee LatchMclean.

## 2014-11-15 ENCOUNTER — Ambulatory Visit (INDEPENDENT_AMBULATORY_CARE_PROVIDER_SITE_OTHER): Payer: 59 | Admitting: Cardiology

## 2014-11-15 VITALS — BP 106/66 | HR 65 | Ht 66.0 in | Wt 160.0 lb

## 2014-11-15 DIAGNOSIS — R079 Chest pain, unspecified: Secondary | ICD-10-CM

## 2014-11-15 DIAGNOSIS — I201 Angina pectoris with documented spasm: Secondary | ICD-10-CM

## 2014-11-15 MED ORDER — AMLODIPINE BESYLATE 5 MG PO TABS: 5.0000 mg | ORAL_TABLET | Freq: Every day | ORAL | Status: AC

## 2014-11-15 MED ORDER — ISOSORBIDE MONONITRATE ER 30 MG PO TB24: 30.0000 mg | ORAL_TABLET | Freq: Every day | ORAL | Status: DC

## 2014-11-15 NOTE — Patient Instructions (Signed)
Since you are moving to [place] and will be seeing a cardiologist there you do not need to schedule a follow up appointment with Dr Shirlee LatchMcLean.

## 2014-11-16 ENCOUNTER — Encounter: Payer: Self-pay | Admitting: Cardiology

## 2014-11-16 NOTE — Progress Notes (Signed)
Patient ID: Stephanie LobeKatie Nash, female   DOB: 01/05/1986, 28 y.o.   MRN: [id number] 28 yo with history of possible coronary artery vasospasm presents for followup.  She had an extensive workup after presentation to hospital in [place] in 10/14 with chest pain and elevated troponin.  Workup was negative for coronary dissection, obstructive CAD, PE, and coronary anomaly.  She presented again to [facility] in 1/15 with chest pain and ruled out for MI.  She says that she has an episode about every 3 months, and the episodes tend to be associated with emotional stress (bad day at work, etc).  She has been able to tolerate Imdur 30 mg daily without headaches and she is on amlodipine.  She exercises regularly (jogging) with no exertional symptoms.  Most recently, she had an episode of chest pain at work in 10/15.  She took NTG and it resolved.    Labs (10/14): TSH normal Labs (1/15): K 4, creatinine 0.9  PMH: 1. Suspect coronary vasospasm: Chest pain in 10/14 in setting of emotion stress.  TnI to 2.67.  She had PE CT that showed no PE. She had coronary CT angiogram that showed no dissection, obstructive CAD, or coronary anomaly.  TSH was normal.  She had a submaximal ETT prior to discharge that was normal.  Echo with EF 50-55% (All done at the hospital in [place]).  She had repeat admission with CP to [facility] in 1/15, workup showed normal troponin and was otherwise unremarkable.   2. 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  Occasional ETOH.   FH: No premature CAD.   ROS: All systems reviewed and negative except as per HPI.   Current Outpatient Prescriptions  Medication Sig Dispense Refill  . amLODipine (NORVASC) 5 MG tablet Take 1 tablet (5 mg total) by mouth daily. 90 tablet 3  . isosorbide mononitrate (IMDUR) 30 MG 24 hr tablet Take 1 tablet (30 mg total) by mouth daily. 90 tablet 3  . nitroGLYCERIN (NITROSTAT) 0.4 MG SL tablet Place 1 tablet (0.4 mg  total) under the tongue every 5 (five) minutes as needed for chest pain (up to 3 doses). 25 tablet 3   No current facility-administered medications for this visit.    BP 106/66 mmHg  Pulse 65  Ht 5\' 6"  (1.676 m)  Wt 160 lb (72.576 kg)  BMI 25.84 kg/m2 General: NAD Neck: No JVD, no thyromegaly or thyroid nodule.  Lungs: Clear to auscultation bilaterally with normal respiratory effort. CV: Nondisplaced PMI.  Heart regular S1/S2, no S3/S4, no murmur.  No peripheral edema.  No carotid bruit.  Normal pedal pulses.  Abdomen: Soft, nontender, no hepatosplenomegaly, no distention.  Skin: Intact without lesions or rashes.  Neurologic: Alert and oriented x 3.  Psych: Normal affect. Extremities: No clubbing or cyanosis.   Assessment/Plan:  28 yo with history of suspected coronary vasospasm.  Symptoms resolve with NTG.  Coronary CT showed no obstructive CAD, no coronary dissection, and no coronary anomaly.  PE CT at initial presentation showed no PE.  She had elevated troponin at first presentation, no elevation of troponin at [facility] in 1/15.  Symptoms seem to be related to emotional stress.  - Continue amlodipine and Imdur at current doses.  - She asks about pregnancy.  She should be able to continue amlodipine and Imdur during pregnancy.  Given the stress of pregnancy, vasospasm may be more likely to occur, but no way to know for  sure.  Would make sure she stays on her meds as long as BP tolerates.  Would wait a year after NSTEMI event if possible.  - [place] and [place] going [place].  Therefore, she is going to establish with cardiology [place] and will followup here only as needed.   Marca AnconaDalton Mackensey Bolte 11/16/2014

## 2015-02-16 ENCOUNTER — Other Ambulatory Visit: Payer: Self-pay

## 2015-02-16 DIAGNOSIS — I201 Angina pectoris with documented spasm: Secondary | ICD-10-CM

## 2015-02-16 MED ORDER — ISOSORBIDE MONONITRATE ER 30 MG PO TB24: 30.0000 mg | ORAL_TABLET | Freq: Every day | ORAL | Status: AC

## 2015-10-23 IMAGING — CR DG CHEST 1V PORT
1 series · 1 of 1 positions shown · non-contrast
Comparison: None.

CLINICAL DATA: Chest pain

EXAM:
PORTABLE CHEST - 1 VIEW

[AP]
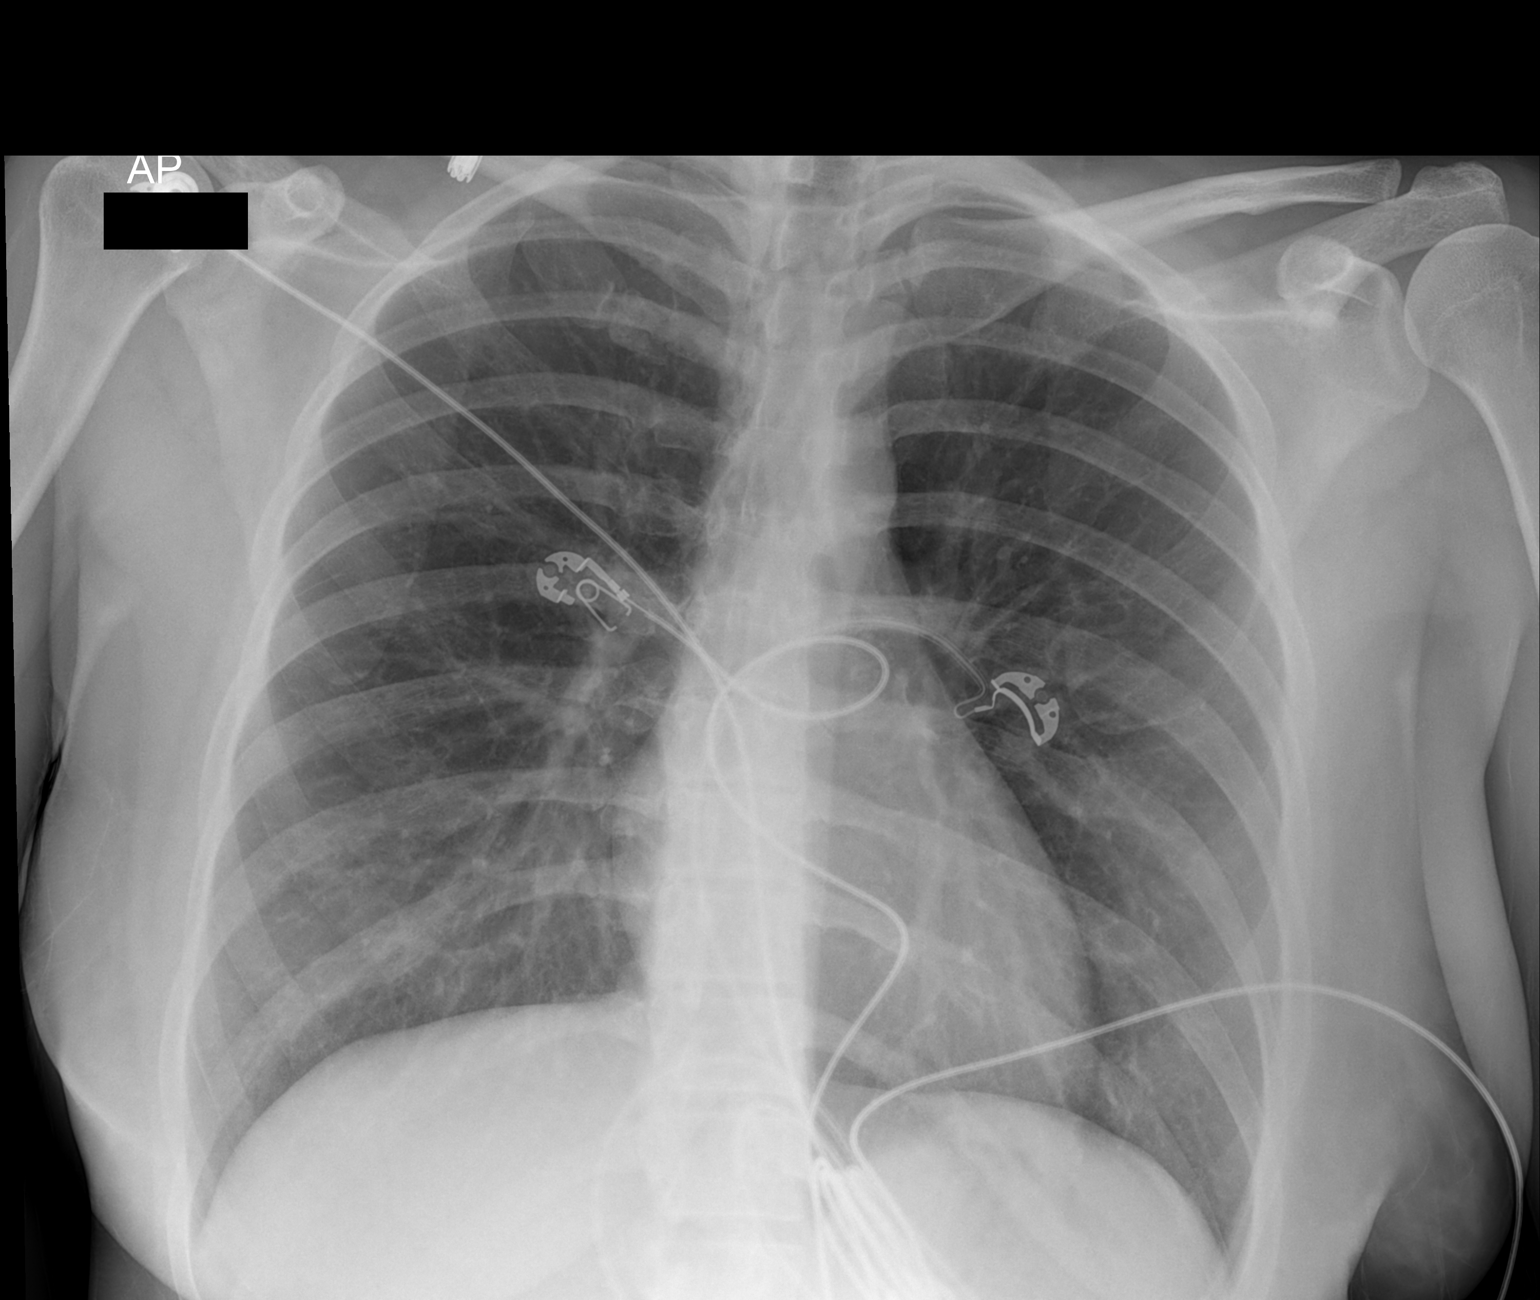

[1 of 1 positions shown; findings below may reference images not displayed]

FINDINGS: The heart size and mediastinal contours are within normal limits.
Both lungs are clear. The visualized skeletal structures are
unremarkable.
IMPRESSION: No active disease.

## 2015-12-26 ENCOUNTER — Inpatient Hospital Stay
Admission: RE | Admit: 2015-12-26 | Discharge: 2015-12-26 | Disposition: A | Discharge status: Home / Self Care | Payer: Self-pay | Source: Ambulatory Visit | Attending: Radiation Oncology | Admitting: Radiation Oncology

## 2015-12-26 ENCOUNTER — Other Ambulatory Visit: Payer: Self-pay | Admitting: Radiation Oncology

## 2015-12-26 DIAGNOSIS — H47339 Pseudopapilledema of optic disc, unspecified eye: Secondary | ICD-10-CM
